# Patient Record
Sex: Male | Born: 1990 | Race: White | Hispanic: No | Marital: Married | Smoking: Never smoker
Health system: Southern US, Community
[De-identification: ages and names within clinical notes are randomized; demographics above are authoritative.]

## PROBLEM LIST (undated history)

## (undated) DIAGNOSIS — F419 Anxiety disorder, unspecified: Secondary | ICD-10-CM

## (undated) DIAGNOSIS — G43909 Migraine, unspecified, not intractable, without status migrainosus: Secondary | ICD-10-CM

## (undated) DIAGNOSIS — R0989 Other specified symptoms and signs involving the circulatory and respiratory systems: Secondary | ICD-10-CM

## (undated) DIAGNOSIS — J342 Deviated nasal septum: Principal | ICD-10-CM

## (undated) HISTORY — DX: Anxiety disorder, unspecified: F41.9

---
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
## 2011-04-19 NOTE — H&P (Signed)
NAMECREEK, GAN NO.:  [id number]   MEDICAL RECORD NO.:  [id number]          PATIENT TYPE:  IPS   LOCATION:  0501                          FACILITY:  [facility]   PHYSICIAN:  Geoffery Lyons, M.D.      DATE OF BIRTH:  [birth year]   DATE OF ADMISSION:  02/14/2008  DATE OF DISCHARGE:                       PSYCHIATRIC ADMISSION ASSESSMENT   IDENTIFYING INFORMATION:  An 20 year old white male who is single.  This  is a voluntary admission.   CHIEF COMPLAINT:  I tried to kill myself.   HISTORY OF PRESENT ILLNESS:  This 20 year old reports that he had taken  between 4 and 6 mg of Xanax yesterday and had been drinking some alcohol  at least every other day this week, and then he developed suicidal  thoughts, was working up the courage to cut himself.  Family found him  and stopped him, felt that he was upset about his girlfriend telling him  that she did not want him to call her for the next 30 days and wanted  some of her own space, time to think about their relationship.  Today,  he reports that he regrets that he had gotten that depressed, depressed  that he took Xanax, although says that he uses this regularly to stay  calmed down.  Says that he really does not want to kill himself, but  admits to using Xanax and alcohol.  Feels stressed by relationships at  home, a lot of pressure with his grandmother getting sick, and friction  arguing with his mother.  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  He denies  suicidal thoughts today, regrets his actions of yesterday, has voiced  his goal of wanting to be here to work on getting off substances.  The  patient reports that he uses anywhere from 1 to 3 mg of Xanax about  every other day, helps him stay calm time and has also been using his  girlfriend's Adderall approximately 60 mg, just on rare  occasion when  she will allow him to have some.   PAST PSYCHIATRIC HISTORY:  No prior psychiatric admissions.  He reports  that he had some difficulty with all learning, growing up with  restlessness, difficulty with concentration, began dropping out of  school in the ninth grade to smoke marijuana and drink alcohol.  He  started seeing a counselor around the age of 76, because he had reported  having dreams of wanting to kill a relative, although he never acted on  any of these dreams. He denies ever being prescribed psychotropic  medication but does take his girlfriend's Adderall.  He endorses a  history of difficulty with concentration, restlessness, and has never  had testing for ADD.   SOCIAL HISTORY:  The patient lives with his grandmother, mother and  sister, all in the home.  Mother with a history of alcohol and drug  problems, who is now achieved abstinence.  His father is not in his  life.  He has had three jobs in the past year and has a history of  misdemeanor charges for underage drinking.  No history of DWI.  Dropped  out of school in the ninth grade as noted.  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   MEDICAL HISTORY:  No regular primary care Kersti Scavone.  Medical problems  are none.   MEDICATIONS:  None.   DRUG ALLERGIES:  NO KNOWN DRUG ALLERGIES.   PHYSICAL EXAM:  GENERAL:  Was done in the emergency room at [facility],  as noted in the record.  This is a healthy 20 year old who appears to be  his stated age, pleasant cooperative.  He is in no acute distress, no  overt signs of any drug withdrawal, 5 feet 11 inches tall, 145 pounds.  VITAL SIGNS:  Temperature 97.7, pulse 95, respirations 18, blood  pressure 112/65.   DIAGNOSTIC STUDIES:  Were done in the emergency room.  Chemistry:  Sodium 141, potassium 3.9, chloride 108, carbon dioxide is 25, BUN 6,  creatinine is  1.1 and random glucose 96.  Alcohol level was 172, urine  drug screen positive for benzodiazepines and marijuana.  Acetaminophen  level was less 10.   MENTAL STATUS EXAM:  Fully alert gentleman, cooperative, in full contact  with reality, engaged in conversation, eye contact is good.  He is  polite.  Speech normal in pace, tone and production, gives a coherent  history.  Mood is depressed.  He regrets a suicide attempt, feels he has  been kind of weighed down by the conflict at home and gets distracted  and frustrated.  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  Thought process logical and  coherent.  No active suicidal thoughts today.  Has voiced his regret  over his actions yesterday.  Voices intent to get off and stay off Xanax  and alcohol.  No homicidal thoughts.  Cognition is fully preserved.  Insight is adequate.  Thinking is goal directed.  No evidence of  psychosis or confusion.   Axis I:  Depressive disorder NOS.  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  Axis II:  Deferred.  Axis III:  No diagnosis.  Axis IV:  Severe stress with adjustment to stage of life.  Stress from  relationship breakup.  Axis V:  Current, 45, past year 110.   PLAN:  To voluntarily admit the patient with every  15-minute checks in  place, with a goal of alleviating a suicidal thought.  We are going to  ask one of our counselors to begin an evaluation for attention deficit  disorder.  We are going to check his hepatic function and an RPR.  Meanwhile, we are going to start him on Librium 25 mg q.6 h. p.r.n., and  we will hear his mother's concerns.      Margaret A. Scott, N.P.      Geoffery Lyons, M.D.  Electronically Signed    MAS/MEDQ  D:  02/15/2008  T:  02/16/2008  Job:   [id number]

## 2011-04-22 NOTE — Discharge Summary (Signed)
NAMECAYDEN, GRANHOLM NO.:  [id number]   MEDICAL RECORD NO.:  [id number]          PATIENT TYPE:  IPS   LOCATION:  0501                          FACILITY:  [facility]   PHYSICIAN:  Geoffery Lyons, M.D.      DATE OF BIRTH:  01/19/1991   DATE OF ADMISSION:  02/14/2008  DATE OF DISCHARGE:  02/26/2008                               DISCHARGE SUMMARY   CHIEF COMPLAINT:  This was the first admission to [facility] for this 20 year old white male, single, voluntarily admitted.  He endorsed that he tried to kill himself.  He had taken between 4 and 6  mg of Xanax and had been drinking some alcohol at least every other day  and then he developed suicidal thoughts. He was working up the courage  to cut himself. Family found him and stopped him. Felt that he was upset  about his girlfriend telling him that she did not want him to call her  for the next 30 days. That she wanted some of her own space and time to  think about the relationship.  He did admit to using alcohol and Xanax.  Felt stressed by relationship.  At home of pressure with his grandmother  getting sick, friction and arguing with his mother where he really  wanted to leave the home and go and get into the [employer] in the next 4  months.  Had a hard time handling some of what he perceived as family  stress. Denied suicidal thoughts.  Regrets his actions. Voices call of  wanting to be in the unit to work on getting off the substances.   PAST PSYCHIATRIC HISTORY:  No prior psychiatric treatment. Long history  of difficulty with restlessness, with concentration, dropped out of  school in ninth grade. Had never been tested for ADHD but has used  girlfriends Adderall he claims successfully.   Also endorsed using marijuana as well as Xanax and alcohol.   MEDICAL HISTORY:  Noncontributory.   MEDICATIONS:  None prescribed.   PHYSICAL EXAM:  Failed to show any acute findings.   LABORATORY WORK:  Sodium 141,  potassium 3.9, BUN 6, creatinine 1.1,  glucose 96.  Alcohol level was 172.  UDS positive for benzodiazepines  and marijuana.   MENTAL STATUS EXAM:  Reveals a fully alert cooperative male in full  contact with reality. Speech was normal in pace, tone and production.  Mood was depressed.  Affect was depressed.  Endorsed being frustrated  with all the stress going on at home as well as due to the relationship  with the girlfriend who wanted some time off the relationship. Endorsed  no active suicidal ideations.  There are no evidence of delusions.  No  hallucinations.  Cognition well-preserved.   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  Benzodiazepine, alcohol abuse, marijuana abuse.  AXIS II: No diagnosis.  AXIS III:  No diagnosis.  AXIS IV: Moderate.  AXIS V:  On admission 45, GAF in the last year 70.   COURSE IN THE HOSPITAL:  He was admitted, started individual and  group  psychotherapy. He was placed on some Librium 25 on as needed basis.  He  was also given some Seroquel.  As already stated, he endorsed he was  depressed.  He took the Xanax, drank alcohol. tried to kill himself.  List of stressors- girlfriend, situation at home, planning to go into  the [employer]. Occasional using some Oxy 80s.  Thinking he was ADHD and  unable to focus, unable to write and remember what he wrote or what he  read.  Constantly changing the topic of conversation, left school at  ninth grade due to drugs and what he claims being distracted. He was  focused on trying to get the girlfriend to come to see him while he was  in the hospital. Having a hard time accepting that she might not want to  get together as he was not getting the response that he expected from  the girlfriend.  He then claimed that he was feeling again suicidal. He  had taken Paxil apparently in the past successfully and he was wanting  to try the Paxil again. He was started on 20 mg. The  next few days were  mostly of him complaining of anxiety, wanting something to help his  nerves.  He was given some Seroquel.  He wanted more than what  initially was made available to him. We tried to arrange a family  session with the girlfriend, but she endorsed that she was not going to  be able to come and this created more stress for him. Continued to  endorse that if he was to hear the girlfriend did not want to get back  with him that he would not know why he was going to with work on coping  skills, stress management.  Continued to endorse anxiety.  Sleep was an  issue.  By March 17,  he had not heard from the girlfriend. In his every  day interactions there was increasing evidence that indeed he might have  some difficulty with attention span, distractibility. He met with the  with the psychology intern. She did ADHD RS on a semi structure  background interview to rule out ADHD. There were significant range in  both the patient attention and had a hyperactivity impulsive sub scales  consistent with a diagnosis of ADHD combined type. Symptoms beginning  around age 63. These were validated by information given by the mother  and  we basically decided to postpone any treatment with stimulants  until he was out of the hospital and this was deferred to the outpatient  Ami Thornsberry.  He continued to endorse the longer history of anxiety,  frustration. Felt that the Seroquel was helping. Was concerned about  going back to the home as he claimed that there was another person stay  in the house that was abusing marijuana and drinking. Social worker was  trying to find some where else tentative to where he where could go.  Some alternate placement options were found.  On March 23, he was in  full contact reality.  In many ways he was better. Did not want to take  as much of the Seroquel.  He was able to deal with the anxiety in some  other ways other than depending completely on a medication.   He had  accepted the fact that the girlfriend was not going to want to be back  with him. While in the unit he met Marine that came from [place] and this  gentleman  did some mentoring and supported his plan of joining the  [employer]. He was very excited about this plan.  Wanted to keep himself  substance free. Was wanting to go back [facility] get his GED and work towards  joining [employer] in the next 4 months.   DISCHARGE DIAGNOSES:  AXIS I: Depressive disorder not otherwise  specified, alcohol, benzodiazepine, marijuana and opiate abuse. ADHD.  AXIS II: No diagnosis.  AXIS III:  No diagnosis.  AXIS IV: Moderate.  AXIS V:  On discharge 55-60.   Discharged on Paxil 20 mg per day, Seroquel 50 one to two at night for  sleep.  Followup at the [facility].      Geoffery Lyons, M.D.  Electronically Signed     IL/MEDQ  D:  03/24/2008  T:  03/24/2008  Job:  [id number]

## 2011-08-29 LAB — RAPID URINE DRUG SCREEN, HOSP PERFORMED
Amphetamines: NOT DETECTED
Barbiturates: NOT DETECTED
Cocaine: NOT DETECTED
Tetrahydrocannabinol: POSITIVE — AB

## 2011-08-29 LAB — ACETAMINOPHEN LEVEL: Acetaminophen (Tylenol), Serum: <10 — ABNORMAL LOW

## 2011-08-29 LAB — I-STAT 8, (EC8 V) (CONVERTED LAB)
Acid-base deficit: 3 — ABNORMAL HIGH
BUN: 6
Glucose, Bld: 96
HCT: 47
TCO2: 25
pH, Ven: 7.327 — ABNORMAL HIGH

## 2011-08-29 LAB — ETHANOL: Alcohol, Ethyl (B): 172 — ABNORMAL HIGH

## 2011-08-29 LAB — POCT I-STAT CREATININE: Operator id: 270651

## 2013-11-04 DIAGNOSIS — J342 Deviated nasal septum: Secondary | ICD-10-CM

## 2013-11-04 HISTORY — DX: Deviated nasal septum: J34.2

## 2013-12-02 ENCOUNTER — Encounter (HOSPITAL_BASED_OUTPATIENT_CLINIC_OR_DEPARTMENT_OTHER): Payer: Self-pay | Admitting: *Deleted

## 2013-12-02 DIAGNOSIS — R0989 Other specified symptoms and signs involving the circulatory and respiratory systems: Secondary | ICD-10-CM

## 2013-12-02 HISTORY — DX: Other specified symptoms and signs involving the circulatory and respiratory systems: R09.89

## 2013-12-09 ENCOUNTER — Encounter (HOSPITAL_COMMUNITY): Payer: Self-pay | Admitting: Emergency Medicine

## 2013-12-09 ENCOUNTER — Encounter (HOSPITAL_BASED_OUTPATIENT_CLINIC_OR_DEPARTMENT_OTHER): Payer: Self-pay

## 2013-12-09 ENCOUNTER — Ambulatory Visit (HOSPITAL_BASED_OUTPATIENT_CLINIC_OR_DEPARTMENT_OTHER)
Admission: RE | Admit: 2013-12-09 | Discharge: 2013-12-09 | Disposition: A | Discharge status: Home / Self Care | Payer: BC Managed Care – PPO | Source: Ambulatory Visit | Attending: Otolaryngology | Admitting: Otolaryngology

## 2013-12-09 ENCOUNTER — Encounter (HOSPITAL_BASED_OUTPATIENT_CLINIC_OR_DEPARTMENT_OTHER)
Admission: RE | Disposition: A | Discharge status: Home / Self Care | Payer: Self-pay | Source: Ambulatory Visit | Attending: Otolaryngology

## 2013-12-09 ENCOUNTER — Emergency Department (HOSPITAL_COMMUNITY)
Admission: EM | Admit: 2013-12-09 | Discharge: 2013-12-09 | Disposition: A | Discharge status: Home / Self Care | Payer: BC Managed Care – PPO | Attending: Emergency Medicine | Admitting: Emergency Medicine

## 2013-12-09 ENCOUNTER — Encounter (HOSPITAL_BASED_OUTPATIENT_CLINIC_OR_DEPARTMENT_OTHER): Payer: BC Managed Care – PPO | Admitting: Anesthesiology

## 2013-12-09 ENCOUNTER — Ambulatory Visit (HOSPITAL_BASED_OUTPATIENT_CLINIC_OR_DEPARTMENT_OTHER): Payer: BC Managed Care – PPO | Admitting: Anesthesiology

## 2013-12-09 DIAGNOSIS — R040 Epistaxis: Secondary | ICD-10-CM

## 2013-12-09 DIAGNOSIS — J3489 Other specified disorders of nose and nasal sinuses: Secondary | ICD-10-CM | POA: Insufficient documentation

## 2013-12-09 DIAGNOSIS — Z9889 Other specified postprocedural states: Secondary | ICD-10-CM

## 2013-12-09 DIAGNOSIS — J343 Hypertrophy of nasal turbinates: Secondary | ICD-10-CM | POA: Insufficient documentation

## 2013-12-09 DIAGNOSIS — Y838 Other surgical procedures as the cause of abnormal reaction of the patient, or of later complication, without mention of misadventure at the time of the procedure: Secondary | ICD-10-CM | POA: Insufficient documentation

## 2013-12-09 DIAGNOSIS — Z8669 Personal history of other diseases of the nervous system and sense organs: Secondary | ICD-10-CM | POA: Insufficient documentation

## 2013-12-09 DIAGNOSIS — IMO0002 Reserved for concepts with insufficient information to code with codable children: Secondary | ICD-10-CM | POA: Insufficient documentation

## 2013-12-09 DIAGNOSIS — J342 Deviated nasal septum: Secondary | ICD-10-CM | POA: Insufficient documentation

## 2013-12-09 HISTORY — DX: Other specified symptoms and signs involving the circulatory and respiratory systems: R09.89

## 2013-12-09 HISTORY — DX: Migraine, unspecified, not intractable, without status migrainosus: G43.909

## 2013-12-09 HISTORY — PX: NASAL SEPTOPLASTY W/ TURBINOPLASTY: SHX2070

## 2013-12-09 HISTORY — DX: Deviated nasal septum: J34.2

## 2013-12-09 LAB — POCT HEMOGLOBIN-HEMACUE: HEMOGLOBIN: 14.1 g/dL (ref 13.0–17.0)

## 2013-12-09 SURGERY — SEPTOPLASTY, NOSE, WITH NASAL TURBINATE REDUCTION
Anesthesia: General | Laterality: Bilateral

## 2013-12-09 MED ORDER — MUPIROCIN 2 % EX OINT
TOPICAL_OINTMENT | CUTANEOUS | Status: AC
  Filled 2013-12-09: qty 22

## 2013-12-09 MED ORDER — LIDOCAINE-EPINEPHRINE 1 %-1:100000 IJ SOLN
INTRAMUSCULAR | Status: AC
  Filled 2013-12-09: qty 1

## 2013-12-09 MED ORDER — MIDAZOLAM HCL 2 MG/2ML IJ SOLN
INTRAMUSCULAR | Status: AC
  Filled 2013-12-09: qty 2

## 2013-12-09 MED ORDER — HYDROMORPHONE HCL PF 1 MG/ML IJ SOLN
0.2500 mg | INTRAMUSCULAR | Status: DC | PRN
  Administered 2013-12-09 (×4): 0.5 mg via INTRAVENOUS

## 2013-12-09 MED ORDER — OXYCODONE HCL 5 MG PO TABS: 5.0000 mg | ORAL_TABLET | Freq: Once | ORAL | Status: DC | PRN

## 2013-12-09 MED ORDER — OXYMETAZOLINE HCL 0.05 % NA SOLN
NASAL | Status: AC
  Filled 2013-12-09: qty 15

## 2013-12-09 MED ORDER — LIDOCAINE HCL (CARDIAC) 20 MG/ML IV SOLN
INTRAVENOUS | Status: DC | PRN
  Administered 2013-12-09: 100 mg via INTRAVENOUS

## 2013-12-09 MED ORDER — LACTATED RINGERS IV SOLN
INTRAVENOUS | Status: DC
  Administered 2013-12-09 (×2): via INTRAVENOUS

## 2013-12-09 MED ORDER — MORPHINE SULFATE 2 MG/ML IJ SOLN
2.0000 mg | Freq: Once | INTRAMUSCULAR | Status: AC
Start: 2013-12-09 — End: 2013-12-09
  Administered 2013-12-09: 2 mg via INTRAVENOUS
  Filled 2013-12-09: qty 1

## 2013-12-09 MED ORDER — OXYCODONE HCL 5 MG/5ML PO SOLN: 5.0000 mg | Freq: Once | ORAL | Status: DC | PRN

## 2013-12-09 MED ORDER — PROPOFOL 10 MG/ML IV BOLUS
INTRAVENOUS | Status: DC | PRN
  Administered 2013-12-09: 200 mg via INTRAVENOUS

## 2013-12-09 MED ORDER — HYDROMORPHONE HCL PF 1 MG/ML IJ SOLN
INTRAMUSCULAR | Status: AC
  Filled 2013-12-09: qty 1

## 2013-12-09 MED ORDER — CEFAZOLIN SODIUM 1-5 GM-% IV SOLN
INTRAVENOUS | Status: AC
  Filled 2013-12-09: qty 100

## 2013-12-09 MED ORDER — OXYMETAZOLINE HCL 0.05 % NA SOLN
NASAL | Status: DC | PRN
  Administered 2013-12-09: 1 via NASAL

## 2013-12-09 MED ORDER — SODIUM CHLORIDE 0.9 % IV SOLN
Freq: Once | INTRAVENOUS | Status: AC
  Administered 2013-12-09: 1000 mL via INTRAVENOUS

## 2013-12-09 MED ORDER — MIDAZOLAM HCL 2 MG/2ML IJ SOLN: 1.0000 mg | INTRAMUSCULAR | Status: DC | PRN

## 2013-12-09 MED ORDER — FENTANYL CITRATE 0.05 MG/ML IJ SOLN
INTRAMUSCULAR | Status: AC
  Filled 2013-12-09: qty 6

## 2013-12-09 MED ORDER — DEXAMETHASONE SODIUM PHOSPHATE 4 MG/ML IJ SOLN
INTRAMUSCULAR | Status: DC | PRN
  Administered 2013-12-09: 10 mg via INTRAVENOUS

## 2013-12-09 MED ORDER — HYDROCODONE-ACETAMINOPHEN 5-325 MG PO TABS: 1.0000 | ORAL_TABLET | ORAL | Status: DC | PRN

## 2013-12-09 MED ORDER — CEFAZOLIN SODIUM-DEXTROSE 2-3 GM-% IV SOLR
INTRAVENOUS | Status: DC | PRN
  Administered 2013-12-09: 2 g via INTRAVENOUS

## 2013-12-09 MED ORDER — FENTANYL CITRATE 0.05 MG/ML IJ SOLN: 50.0000 ug | INTRAMUSCULAR | Status: DC | PRN

## 2013-12-09 MED ORDER — OXYCODONE-ACETAMINOPHEN 5-325 MG PO TABS
2.0000 | ORAL_TABLET | Freq: Once | ORAL | Status: AC
  Administered 2013-12-09: 2 via ORAL
  Filled 2013-12-09: qty 2

## 2013-12-09 MED ORDER — MIDAZOLAM HCL 2 MG/ML PO SYRP: 12.0000 mg | ORAL_SOLUTION | Freq: Once | ORAL | Status: DC | PRN

## 2013-12-09 MED ORDER — SUCCINYLCHOLINE CHLORIDE 20 MG/ML IJ SOLN
INTRAMUSCULAR | Status: DC | PRN
  Administered 2013-12-09: 100 mg via INTRAVENOUS

## 2013-12-09 MED ORDER — ONDANSETRON HCL 4 MG/2ML IJ SOLN
INTRAMUSCULAR | Status: DC | PRN
  Administered 2013-12-09: 4 mg via INTRAVENOUS

## 2013-12-09 MED ORDER — OXYMETAZOLINE HCL 0.05 % NA SOLN
1.0000 | Freq: Two times a day (BID) | NASAL | Status: DC
  Administered 2013-12-09: 1 via NASAL

## 2013-12-09 MED ORDER — FENTANYL CITRATE 0.05 MG/ML IJ SOLN
INTRAMUSCULAR | Status: DC | PRN
  Administered 2013-12-09: 100 ug via INTRAVENOUS
  Administered 2013-12-09: 50 ug via INTRAVENOUS

## 2013-12-09 MED ORDER — PROMETHAZINE HCL 25 MG/ML IJ SOLN
25.0000 mg | Freq: Once | INTRAMUSCULAR | Status: AC
  Administered 2013-12-09: 25 mg via INTRAVENOUS
  Filled 2013-12-09: qty 1

## 2013-12-09 MED ORDER — MIDAZOLAM HCL 5 MG/5ML IJ SOLN
INTRAMUSCULAR | Status: DC | PRN
  Administered 2013-12-09: 2 mg via INTRAVENOUS

## 2013-12-09 MED ORDER — LIDOCAINE-EPINEPHRINE 1 %-1:100000 IJ SOLN
INTRAMUSCULAR | Status: DC | PRN
  Administered 2013-12-09: 3.5 mL

## 2013-12-09 MED ORDER — MUPIROCIN 2 % EX OINT
TOPICAL_OINTMENT | CUTANEOUS | Status: DC | PRN
  Administered 2013-12-09: 1 via NASAL

## 2013-12-09 MED ORDER — AMOXICILLIN 875 MG PO TABS: 875.0000 mg | ORAL_TABLET | Freq: Two times a day (BID) | ORAL | Status: DC

## 2013-12-09 SURGICAL SUPPLY — 34 items
ATTRACTOMAT 16X20 MAGNETIC DRP (DRAPES) ×3 IMPLANT
CANISTER SUCT 1200ML W/VALVE (MISCELLANEOUS) ×3 IMPLANT
COAGULATOR SUCT 8FR VV (MISCELLANEOUS) ×3 IMPLANT
DECANTER SPIKE VIAL GLASS SM (MISCELLANEOUS) ×3 IMPLANT
DRSG NASOPORE 8CM (GAUZE/BANDAGES/DRESSINGS) IMPLANT
DRSG TELFA 3X8 NADH (GAUZE/BANDAGES/DRESSINGS) IMPLANT
ELECT REM PT RETURN 9FT ADLT (ELECTROSURGICAL) ×3
ELECTRODE REM PT RTRN 9FT ADLT (ELECTROSURGICAL) ×1 IMPLANT
GLOVE BIO SURGEON STRL SZ7.5 (GLOVE) ×3 IMPLANT
GLOVE BIOGEL M 7.0 STRL (GLOVE) ×3 IMPLANT
GLOVE BIOGEL PI IND STRL 7.5 (GLOVE) ×1 IMPLANT
GLOVE BIOGEL PI INDICATOR 7.5 (GLOVE) ×2
GOWN PREVENTION PLUS XLARGE (GOWN DISPOSABLE) ×6 IMPLANT
GOWN STRL REUS W/ TWL LRG LVL3 (GOWN DISPOSABLE) IMPLANT
GOWN STRL REUS W/TWL LRG LVL3 (GOWN DISPOSABLE)
NEEDLE HYPO 25X1 1.5 SAFETY (NEEDLE) ×3 IMPLANT
NS IRRIG 1000ML POUR BTL (IV SOLUTION) ×3 IMPLANT
PACK BASIN DAY SURGERY FS (CUSTOM PROCEDURE TRAY) ×3 IMPLANT
PACK ENT DAY SURGERY (CUSTOM PROCEDURE TRAY) ×3 IMPLANT
SLEEVE SCD COMPRESS KNEE MED (MISCELLANEOUS) ×3 IMPLANT
SOLUTION BUTLER CLEAR DIP (MISCELLANEOUS) ×3 IMPLANT
SPLINT NASAL DOYLE BI-VL (GAUZE/BANDAGES/DRESSINGS) ×3 IMPLANT
SPONGE GAUZE 2X2 8PLY STER LF (GAUZE/BANDAGES/DRESSINGS) ×1
SPONGE GAUZE 2X2 8PLY STRL LF (GAUZE/BANDAGES/DRESSINGS) ×2 IMPLANT
SPONGE NEURO XRAY DETECT 1X3 (DISPOSABLE) ×3 IMPLANT
SUT CHROMIC 4 0 P 3 18 (SUTURE) ×3 IMPLANT
SUT PLAIN 4 0 ~~LOC~~ 1 (SUTURE) ×3 IMPLANT
SUT PROLENE 3 0 PS 2 (SUTURE) ×3 IMPLANT
SUT VIC AB 4-0 P-3 18XBRD (SUTURE) IMPLANT
SUT VIC AB 4-0 P3 18 (SUTURE)
TOWEL OR 17X24 6PK STRL BLUE (TOWEL DISPOSABLE) ×3 IMPLANT
TUBE SALEM SUMP 12R W/ARV (TUBING) IMPLANT
TUBE SALEM SUMP 16 FR W/ARV (TUBING) ×3 IMPLANT
YANKAUER SUCT BULB TIP NO VENT (SUCTIONS) ×3 IMPLANT

## 2013-12-09 NOTE — Transfer of Care (Signed)
Immediate Anesthesia Transfer of Care Note  Patient: Douglas Fitzgerald  Procedure(s) Performed: Procedure(s): NASAL SEPTOPLASTY WITH BILATERAL TURBINATE REDUCTION (Bilateral)  Patient Location: PACU  Anesthesia Type:General  Level of Consciousness: awake and alert   Airway & Oxygen Therapy: Patient Spontanous Breathing and Patient connected to face mask oxygen  Post-op Assessment: Report given to PACU RN and Post -op Vital signs reviewed and stable  Post vital signs: Reviewed and stable  Complications: No apparent anesthesia complications

## 2013-12-09 NOTE — Anesthesia Procedure Notes (Signed)
Procedure Name: Intubation Date/Time: 12/09/2013 8:33 AM Performed by: Caren MacadamARTER, Mykal Kirchman W Pre-anesthesia Checklist: Patient identified, Emergency Drugs available, Suction available and Patient being monitored Patient Re-evaluated:Patient Re-evaluated prior to inductionOxygen Delivery Method: Circle System Utilized Preoxygenation: Pre-oxygenation with 100% oxygen Intubation Type: IV induction Ventilation: Mask ventilation without difficulty Laryngoscope Size: Miller and 2 Tube type: Oral Tube size: 7.0 mm Number of attempts: 1 Airway Equipment and Method: stylet and oral airway Placement Confirmation: ETT inserted through vocal cords under direct vision,  positive ETCO2 and breath sounds checked- equal and bilateral Secured at: 24 cm Tube secured with: Tape Dental Injury: Teeth and Oropharynx as per pre-operative assessment
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
## 2013-12-09 NOTE — ED Notes (Signed)
Talked with Dr. Corliss Marcuseal, states he is on his way to see the patient.

## 2013-12-09 NOTE — ED Notes (Addendum)
Per ems, pt had septoplasty and turbinate reduction surgery this morning, no complications. About 1745, pt nose started trickling, then gushing. Pt in ED, bleeding from both nostrils, AAOx4, Dr. Blinda LeatherwoodPollina at bedside.
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
## 2013-12-09 NOTE — Discharge Instructions (Addendum)
POSTOPERATIVE INSTRUCTIONS FOR PATIENTS HAVING NASAL OR SINUS OPERATIONS ACTIVITY: Restrict activity at home for the first two days, resting as much as possible. Light activity is best. You may usually return to work within a week. You should refrain from nose blowing, strenuous activity, or heavy lifting greater than 20lbs for a total of three weeks after your operation.  If sneezing cannot be avoided, sneeze with your mouth open. DISCOMFORT: You may experience a dull headache and pressure along with nasal congestion and discharge. These symptoms may be worse during the first week after the operation but may last as long as two to four weeks.  Please take Tylenol or the pain medication that has been prescribed for you. Do not take aspirin or aspirin containing medications since they may cause bleeding.  You may experience symptoms of post nasal drainage, nasal congestion, headaches and fatigue for two or three months after your operation.  BLEEDING: You may have some blood tinged nasal drainage for approximately two weeks after the operation.  The discharge will be worse for the first week.  Please call our office at [phone] or go to the nearest hospital emergency room if you experience any of the following: heavy, bright red blood from your nose or mouth that lasts longer than ten minutes or coughing up or vomiting bright red blood or blood clots. GENERAL CONSIDERATIONS: 1. A gauze dressing will be placed on your upper lip to absorb any drainage after the operation. You may need to change this several times a day.  If you do not have very much drainage, you may remove the dressing.  Remember that you may gently wipe your nose with a tissue and sniff in, but DO NOT blow your nose. 2. Please keep all of your postoperative appointments.  Your final results after the operation will depend on proper follow-up.  The initial visit is usually four to seven days after the operation.  During this visit, the  remaining nasal packing and internal septal splints will be removed.  Your nasal and sinus cavities will be cleaned.  During the second visit, your nasal and sinus cavities will be cleaned again. Have someone drive you to your first two postoperative appointments. We suggest that you take your prescribed pain medication about  hour prior to each of these two appointments.  3. How you care for your nose after the operation will influence the results that you obtain.  You should follow all directions, take your medication as prescribed, and call our office [phone] with any problems or questions. 4. You may be more comfortable sleeping with your head elevated on two pillows. 5. Do not take any medications that we have not prescribed or recommended. WARNING SIGNS: if any of the following should occur, please call our office: 1. Bright red bleeding which lasts more than 10 minutes. 2. Persistent fever greater than 102F. 3. Persistent vomiting. 4. Severe and constant pain that is not relieved by prescribed pain medication. 5. Trauma to the nose. 6. Rash or unusual side effects from any medicines.  Your return appointment is JAN.9, 2015 AT 1:40PM  Post Anesthesia Home Care Instructions  Activity: Get plenty of rest for the remainder of the day. A responsible adult should stay with you for 24 hours following the procedure.  For the next 24 hours, DO NOT: -Drive a car -Advertising copywriter -Drink alcoholic beverages -Take any medication unless instructed by your physician -Make any legal decisions or sign important papers.  Meals: Start with liquid  foods such as gelatin or soup. Progress to regular foods as tolerated. Avoid greasy, spicy, heavy foods. If nausea and/or vomiting occur, drink only clear liquids until the nausea and/or vomiting subsides. Call your physician if vomiting continues.  Special Instructions/Symptoms: Your throat may feel dry or sore from the anesthesia or the breathing  tube placed in your throat during surgery. If this causes discomfort, gargle with warm salt water. The discomfort should disappear within 24 hours.

## 2013-12-09 NOTE — Anesthesia Preprocedure Evaluation (Addendum)
Anesthesia Evaluation  Patient identified by MRN, date of birth, ID band Patient awake    Reviewed: Allergy & Precautions, H&P , NPO status , Patient's Chart, lab work & pertinent test results  Airway Mallampati: II TM Distance: >3 FB Neck ROM: Full    Dental no notable dental hx. (+) Teeth Intact and Dental Advisory Given   Pulmonary neg pulmonary ROS,  breath sounds clear to auscultation  Pulmonary exam normal       Cardiovascular negative cardio ROS  Rhythm:Regular Rate:Normal     Neuro/Psych  Headaches, negative neurological ROS  negative psych ROS   GI/Hepatic negative GI ROS, Neg liver ROS,   Endo/Other  negative endocrine ROS  Renal/GU negative Renal ROS  negative genitourinary   Musculoskeletal   Abdominal   Peds  Hematology negative hematology ROS (+)   Anesthesia Other Findings   Reproductive/Obstetrics negative OB ROS                          Anesthesia Physical Anesthesia Plan  ASA: I  Anesthesia Plan: General   Post-op Pain Management:    Induction: Intravenous  Airway Management Planned: Oral ETT  Additional Equipment:   Intra-op Plan:   Post-operative Plan: Extubation in OR  Informed Consent: I have reviewed the patients History and Physical, chart, labs and discussed the procedure including the risks, benefits and alternatives for the proposed anesthesia with the patient or authorized representative who has indicated his/her understanding and acceptance.   Dental advisory given  Plan Discussed with: CRNA  Anesthesia Plan Comments:         Anesthesia Quick Evaluation

## 2013-12-09 NOTE — Progress Notes (Signed)
Pt with postop bleeding from the left nostril and posterior pharynx.  A large amount of blood is suctioned from the left [place].  The Doyle splints are removed.  Bilateral merocel packings are placed.  Pt is given 1L of NS.  The Merocel packing will be removed in 4 days.

## 2013-12-09 NOTE — Op Note (Signed)
DATE OF PROCEDURE: 12/09/2013  OPERATIVE REPORT   SURGEON: Newman PiesSu Rozalyn Osland, MD   PREOPERATIVE DIAGNOSES:  1. Severe nasal septal deviation.  2. Bilateral inferior turbinate hypertrophy.  3. Chronic nasal obstruction.  POSTOPERATIVE DIAGNOSES:  1. Severe nasal septal deviation.  2. Bilateral inferior turbinate hypertrophy.  3. Chronic nasal obstruction.  PROCEDURE PERFORMED:  1. Septoplasty.  2. Bilateral partial inferior turbinate resection.   ANESTHESIA: General endotracheal tube anesthesia.   COMPLICATIONS: None.   ESTIMATED BLOOD LOSS: Less than xxx mL.   INDICATION FOR PROCEDURE: Douglas Fitzgerald is a 23 y.o. male with a history of chronic nasal obstruction. The patient was  treated with antihistamine, decongestant, steroid nasal spray, and systemic steroids. However, the patient continues to be symptomatic. On examination, the patient was noted to have bilateral severe inferior turbinate hypertrophy and significant nasal septal deviation, causing significant nasal obstruction. Based on the above findings, the decision was made for the patient to undergo the above-stated procedure. The risks, benefits, alternatives, and details of the procedure were discussed with the patient. Questions were invited and answered. Informed consent was obtained.   DESCRIPTION OF PROCEDURE: The patient was taken to the operating room and placed supine on the operating table. General endotracheal tube anesthesia was administered by the anesthesiologist. The patient was positioned, and prepped and draped in the standard fashion for nasal surgery. Pledgets soaked with Afrin were placed in both nasal cavities for decongestion. The pledgets were subsequently removed. The above mentioned severe septal deviation was again noted. 1% lidocaine with 1:100,000 epinephrine was injected onto the nasal septum bilaterally. A hemitransfixion incision was made on the left side. The mucosal flap was carefully elevated on the left  side. A cartilaginous incision was made 1 cm superior to the caudal margin of the nasal septum. Mucosal flap was also elevated on the right side in the similar fashion. It should be noted that due to the severe septal deviation, the deviated portion of the cartilaginous and bony septum had to be removed in piecemeal fashion. Once the deviated portions were removed, a straight midline septum was achieved. The septum was then quilted with 4-0 plain gut sutures. The hemitransfixion incision was closed with interrupted 4-0 chromic sutures. Doyle splints were applied.   [facility], the inferior one half of both hypertrophied inferior turbinate was crossclamped with a Kelly clamp. The inferior one half of each inferior turbinate was then resected with a pair of cross cutting scissors. Hemostasis was achieved with a suction cautery device. A small polypoid tissue was noted in the posterior left nasal cavity.  It was removed and sent to the pathology department.  The care of the patient was turned over to the anesthesiologist. The patient was awakened from anesthesia without difficulty. The patient was extubated and transferred to the recovery room in good condition.   OPERATIVE FINDINGS: Severe nasal septal deviation and bilateral inferior turbinate hypertrophy. A polypoid tissue was noted at the posterior left nasal cavity.  SPECIMEN: None.   FOLLOWUP CARE: The patient be discharged home once he is awake and alert. The patient will be placed on vicodin1-2 tablets p.o. q.6 hours p.r.n. pain, and amoxicillin 875 mg p.o. b.i.d. for 5 days. The patient will follow up in my office in approximately 1 week for splint removal.   Journei Thomassen Philomena DohenyWooi Shail Urbas, MD

## 2013-12-09 NOTE — ED Provider Notes (Signed)
CSN: [id number]     Arrival date & time 12/09/13  1843 History   First MD Initiated Contact with Patient 12/09/13 1855     Chief Complaint  Patient presents with  . Epistaxis  . Post-op Problem   (Consider location/radiation/quality/duration/timing/severity/associated sxs/prior Treatment) HPI Comments: Patient presents to the ER for evaluation of nosebleed. Patient underwent septoplasty earlier this morning by Doctor Suszanne Conners. He reports that he was, resting in bed when he started to notice some bleeding from the left side of his nose which then became quite brisk. The patient plating of continuous pain in the nasal area since the surgery without change since the bleeding started. The chest pain or shortness of breath. He denies any trauma to the nose.  Patient is a 23 y.o. male presenting with nosebleeds.  Epistaxis   Past Medical History  Diagnosis Date  . Migraines   . Deviated nasal septum 11/2013  . Runny nose 12/02/2013   Past Surgical History  Procedure Laterality Date  . No past surgeries     No family history on file. History  Substance Use Topics  . Smoking status: Never Smoker   . Smokeless tobacco: Never Used  . Alcohol Use: No    Review of Systems  HENT: Positive for nosebleeds.   All other systems reviewed and are negative.    Allergies  Review of patient's allergies indicates no known allergies.  Home Medications   Current Outpatient Rx  Name  Route  Sig  Dispense  Refill  . amoxicillin (AMOXIL) 875 MG tablet   Oral   Take 1 tablet (875 mg total) by mouth 2 (two) times daily.   10 tablet   0   . HYDROcodone-acetaminophen (NORCO) 5-325 MG per tablet   Oral   Take 1 tablet by mouth every 4 (four) hours as needed for moderate pain or severe pain.   20 tablet   0    BP 112/50  Pulse 102  Resp 18  SpO2 95% Physical Exam  Constitutional: He is oriented to person, place, and time. He appears well-developed and well-nourished. No distress.  HENT:   Head: Normocephalic and atraumatic.  Right Ear: Hearing normal.  Left Ear: Hearing normal.  Nose: Epistaxis (left side - large clot present, persistant bleeding after evacuation) is observed.  Mouth/Throat: Oropharynx is clear and moist and mucous membranes are normal.  Eyes: Conjunctivae and EOM are normal. Pupils are equal, round, and reactive to light.  Neck: Normal range of motion. Neck supple.  Cardiovascular: Regular rhythm, S1 normal and S2 normal.  Exam reveals no gallop and no friction rub.   No murmur heard. Pulmonary/Chest: Effort normal and breath sounds normal. No respiratory distress. He exhibits no tenderness.  Abdominal: Soft. Normal appearance and bowel sounds are normal. There is no hepatosplenomegaly. There is no tenderness. There is no rebound, no guarding, no tenderness at McBurney's point and negative Murphy's sign. No hernia.  Musculoskeletal: Normal range of motion.  Neurological: He is alert and oriented to person, place, and time. He has normal strength. No cranial nerve deficit or sensory deficit. Coordination normal. GCS eye subscore is 4. GCS verbal subscore is 5. GCS motor subscore is 6.  Skin: Skin is warm, dry and intact. No rash noted. No cyanosis.  Psychiatric: He has a normal mood and affect. His speech is normal and behavior is normal. Thought content normal.    ED Course  Procedures (including critical care time) Labs Review Labs Reviewed - No data to display  Imaging Review No results found.  EKG Interpretation   None       MDM  Diagnosis: Epistaxis, postoperative bleed  Patient presents to the ER for evaluation of bleeding from the left side of his nose. Patient underwent septoplasty earlier this morning. Examination reveals large amount of clot in the nose and once evacuated, there is brisk bleeding from the area. A piece of Surgicel was placed in the nose with reduction in the amount of bleeding. Doctor Suszanne Connerseoh will see in ER.    Gilda Creasehristopher  J. Pollina, MD 12/09/13 [id number]

## 2013-12-09 NOTE — Anesthesia Postprocedure Evaluation (Signed)
  Anesthesia Post-op Note  Patient: Douglas Fitzgerald  Procedure(s) Performed: Procedure(s): NASAL SEPTOPLASTY WITH BILATERAL TURBINATE REDUCTION (Bilateral)  Patient Location: PACU  Anesthesia Type:General  Level of Consciousness: awake and alert   Airway and Oxygen Therapy: Patient Spontanous Breathing  Post-op Pain: mild  Post-op Assessment: Post-op Vital signs reviewed, Patient's Cardiovascular Status Stable and Respiratory Function Stable  Post-op Vital Signs: Reviewed  Filed Vitals:   12/09/13 1212  BP: 146/73  Pulse: 94  Temp: 36.8 C  Resp: 16    Complications: No apparent anesthesia complications

## 2013-12-09 NOTE — Discharge Instructions (Signed)

## 2013-12-10 ENCOUNTER — Encounter (HOSPITAL_BASED_OUTPATIENT_CLINIC_OR_DEPARTMENT_OTHER): Payer: Self-pay | Admitting: Otolaryngology

## 2018-06-11 ENCOUNTER — Encounter: Payer: Self-pay | Admitting: Family Medicine

## 2018-06-11 ENCOUNTER — Ambulatory Visit (INDEPENDENT_AMBULATORY_CARE_PROVIDER_SITE_OTHER): Payer: Self-pay | Admitting: Family Medicine

## 2018-06-11 DIAGNOSIS — F419 Anxiety disorder, unspecified: Secondary | ICD-10-CM | POA: Insufficient documentation

## 2018-06-11 DIAGNOSIS — F1911 Other psychoactive substance abuse, in remission: Secondary | ICD-10-CM | POA: Insufficient documentation

## 2018-06-11 DIAGNOSIS — F321 Major depressive disorder, single episode, moderate: Secondary | ICD-10-CM

## 2018-06-11 MED ORDER — CITALOPRAM HYDROBROMIDE 20 MG PO TABS: 40.0000 mg | ORAL_TABLET | Freq: Every day | ORAL | 3 refills | Status: DC

## 2018-06-11 NOTE — Assessment & Plan Note (Signed)
GAD significantly elevated to 21 today.  Discussed treatment options with patient.  He would like to start daily medication.  Will start Celexa 20 mg daily and increase to 40 mg daily if he tolerates well within 1 to 2 weeks.  Discussed potential side effects of this medication.  Also discussed psychotherapy, however patient declined at this point as he would be unable to afford it.  He will follow-up with me in 4 to 6 weeks.

## 2018-06-11 NOTE — Assessment & Plan Note (Signed)
See anxiety A/P.  PHQ 9 elevated 18 today.  Start Celexa and increase to 40 mg daily as tolerated.  Follow-up in 4 to 6 weeks.

## 2018-06-11 NOTE — Patient Instructions (Addendum)
It was very nice to see you today!  We will start celexa today.  Please take 1 pill a day for the next 1-2 weeks. If you do well without side effects, you can increase to 1 pills daily  It will take about 6 weeks for these meds to get to their maximal effectiveness.   Please let me know if you have any severe side effects.  I would like to see you back in 4-6 weeks to check in, or sooner as needed.  Take care, Dr Jimmey RalphParker

## 2018-06-11 NOTE — Progress Notes (Signed)
Subjective:  Douglas Fitzgerald is a 27 y.o. male who presents today with a chief complaint of anxiety and to establish care.   HPI:  Depression/Anxiety, New problems Symptoms started about 10 years ago. Symptoms have worsened over the past few weeks. He recently found out his wife is pregnant which has worsened his symptoms.  Symptoms seem to be worse in high stress environments at work.  Frequently will feel very tensed up and start sweating.  Symptoms sometimes talk panic attack.  He is also had itching all over his skin.  Appetite is been decreased as well.  Sleeping normally.  No obvious alleviating or aggravating factors.    Depression screen PHQ 2/9 06/11/2018  Decreased Interest 3  Down, Depressed, Hopeless 3  PHQ - 2 Score 6  Altered sleeping 2  Tired, decreased energy 0  Change in appetite 1  Feeling bad or failure about yourself  3  Trouble concentrating 3  Moving slowly or fidgety/restless 3  Suicidal thoughts 0  PHQ-9 Score 18  Difficult doing work/chores Very difficult    GAD 7 : Generalized Anxiety Score 06/11/2018  Nervous, Anxious, on Edge 3  Control/stop worrying 3  Worry too much - different things 3  Trouble relaxing 3  Restless 3  Easily annoyed or irritable 3  Afraid - awful might happen 3  Total GAD 7 Score 21  Anxiety Difficulty Somewhat difficult    ROS: No SI or HI.  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  ROS: Per HPI, otherwise a complete review of systems was negative.   PMH:  The following were reviewed and entered/updated in epic: Past Medical History:  Diagnosis Date  . Anxiety   . Deviated nasal septum 11/2013  . Migraines   . Runny nose 12/02/2013   Patient Active Problem List   Diagnosis Date Noted  . Anxiety 06/11/2018  . Depression, major, single episode, moderate (HCC) 06/11/2018  . 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 NASAL SEPTOPLASTY W/ TURBINOPLASTY Bilateral 12/09/2013   Procedure: NASAL SEPTOPLASTY WITH BILATERAL TURBINATE REDUCTION;  Surgeon: Darletta Moll, MD;  Location: [facility];  Service: ENT;  Laterality: Bilateral;    Family History  Problem Relation Age of Onset  . Depression Mother        Died due to MVA  . Cancer Father     Medications- reviewed and updated Current Outpatient Medications  Medication Sig Dispense Refill  . citalopram (CELEXA) 20 MG tablet Take 2 tablets (40 mg total) by mouth daily. 60 tablet 3   No current facility-administered medications for this visit.     Allergies-reviewed and updated No Known Allergies  Social History   Socioeconomic History  . Marital status: Married    Spouse name: Not on file  . Number of children: 1  . Years of education: Not on file  . Highest education level: Not on file  Occupational History  . Not on file  Social Needs  . Financial resource strain: Not on file  . Food insecurity:    Worry: Not on file    Inability: Not on file  . Transportation needs:    Medical: Not on file    Non-medical: Not on file  Tobacco Use  . Smoking status: Never Smoker  . Smokeless tobacco: Never Used  Substance and Sexual Activity  .  Alcohol use: No  . Drug use: No  . Sexual activity: Yes  Lifestyle  . Physical activity:    Days per week: Not on file    Minutes per session: Not on file  . Stress: Not on file  Relationships  . Social connections:    Talks on phone: Not on file    Gets together: Not on file    Attends religious service: Not on file    Active member of club or organization: Not on file    Attends meetings of clubs or organizations: Not on file    Relationship status: Not on file  Other Topics Concern  . Not on file  Social History Narrative  . Not on file    Objective:  Physical Exam: BP 134/78 (BP Location: Left Arm, Patient Position: Sitting, Cuff  Size: Normal)   Pulse 80   Temp 98.4 F (36.9 C) (Oral)   Ht 6\' 2"  (1.88 m)   Wt 199 lb 3.2 oz (90.4 kg)   SpO2 98%   BMI 25.58 kg/m   Gen: NAD, resting comfortably CV: RRR with no murmurs appreciated Pulm: NWOB, CTAB with no crackles, wheezes, or rhonchi GI: Normal bowel sounds present. Soft, Nontender, Nondistended. MSK: No edema, cyanosis, or clubbing noted Skin: Warm, dry Neuro: Grossly normal, moves all extremities Psych: Normal affect and thought content  Assessment/Plan:  Anxiety GAD significantly elevated to 21 today.  Discussed treatment options with patient.  He would like to start daily medication.  Will start Celexa 20 mg daily and increase to 40 mg daily if he tolerates well within 1 to 2 weeks.  Discussed potential side effects of this medication.  Also discussed psychotherapy, however patient declined at this point as he would be unable to afford it.  He will follow-up with me in 4 to 6 weeks.  Depression, major, single episode, moderate (HCC) See anxiety A/P.  PHQ 9 elevated 18 today.  Start Celexa and increase to 40 mg daily as tolerated.  Follow-up in 4 to 6 weeks.  Substance abuse in remission [facility](HCC) Patient has been clean for 10 years.  Congratulated him on his abstinence.  Katina Degreealeb M. Jimmey RalphParker, MD 06/11/2018 4:06 PM

## 2018-06-11 NOTE — Assessment & Plan Note (Signed)
Patient has been clean for 10 years.  Congratulated him on his abstinence.

## 2018-07-09 ENCOUNTER — Ambulatory Visit: Payer: Self-pay | Admitting: Family Medicine

## 2018-07-09 DIAGNOSIS — Z0289 Encounter for other administrative examinations: Secondary | ICD-10-CM

## 2018-07-10 ENCOUNTER — Encounter: Payer: Self-pay | Admitting: Family Medicine

## 2019-01-21 ENCOUNTER — Telehealth: Payer: Self-pay | Admitting: Family Medicine

## 2019-01-21 NOTE — Telephone Encounter (Signed)
Copied from CRM [id number]. Topic: General - Other >> Jan 21, 2019  2:34 PM Laural Benes, [place] C wrote: Reason for CRM: pt called in to schedule an ov with provider to be cleared to return to work. Pt says that he was asked within his wellness visit at work if he has ever had a substance abuse problem, pt says that he answered truthfully, that he battled substance abuse 11 years ago. Pt says that his job advised him to see his general provider to be cleared.

## 2019-01-22 ENCOUNTER — Encounter: Payer: Self-pay | Admitting: Family Medicine

## 2019-01-22 ENCOUNTER — Ambulatory Visit (INDEPENDENT_AMBULATORY_CARE_PROVIDER_SITE_OTHER): Payer: Managed Care, Other (non HMO) | Admitting: Family Medicine

## 2019-01-22 VITALS — BP 110/66 | HR 79 | Temp 98.0°F | Ht 74.0 in | Wt 199.2 lb

## 2019-01-22 DIAGNOSIS — F419 Anxiety disorder, unspecified: Secondary | ICD-10-CM

## 2019-01-22 DIAGNOSIS — F1911 Other psychoactive substance abuse, in remission: Secondary | ICD-10-CM | POA: Diagnosis not present

## 2019-01-22 DIAGNOSIS — F321 Major depressive disorder, single episode, moderate: Secondary | ICD-10-CM

## 2019-01-22 DIAGNOSIS — Z6825 Body mass index (BMI) 25.0-25.9, adult: Secondary | ICD-10-CM | POA: Diagnosis not present

## 2019-01-22 NOTE — Patient Instructions (Signed)
It was very nice to see you today!  We will write a letter for you to go back to work.  Come back to see me soon for your annual physical, or sooner as needed.   Take care, Dr Jimmey Ralph

## 2019-01-22 NOTE — Assessment & Plan Note (Addendum)
GAD negative.

## 2019-01-22 NOTE — Telephone Encounter (Signed)
Noted.  Patient on the schedule for today.

## 2019-01-22 NOTE — Progress Notes (Signed)
   Chief Complaint:  Douglas Fitzgerald is a 28 y.o. male who presents today with a chief complaint of history of substance abuse.   Assessment/Plan:  Substance abuse in remission [facility]) He has been abstinent from most 11 years.  Congratulated on this.  Letter was given stating he was fit to go back to work.  Depression, major, single episode, moderate (HCC) PHQ negative.  Does not need further medication or treatment at this time  Anxiety GAD negative.      Subjective:  HPI:  Depression/Anxiety, established problems, Stable Seen about 6 months ago for this. Was started on celexa however he did not tolerate due to side effects.  Overall, symptoms are stable.  Does not want to start any other medications at this point.  Depression screen [facility] 2/9 01/22/2019  Decreased Interest 0  Down, Depressed, Hopeless 0  PHQ - 2 Score 0  Altered sleeping 0  Tired, decreased energy 0  Change in appetite 0  Feeling bad or failure about yourself  -  Trouble concentrating 1  Moving slowly or fidgety/restless 0  Suicidal thoughts 0  PHQ-9 Score 1  Difficult doing work/chores Somewhat difficult   GAD 7 : Generalized Anxiety Score 01/22/2019  Nervous, Anxious, on Edge 0  Control/stop worrying 0  Worry too much - different things 0  Trouble relaxing 0  Restless 1  Easily annoyed or irritable 1  Afraid - awful might happen 2  Total GAD 7 Score 4  Anxiety Difficulty Somewhat difficult   Substance Abuse in Remission Has been clean for almost 11 years.  Recently was told that he needed to have a clearance from a physician to go back to work.  ROS: Per HPI  PMH: He reports that he has never smoked. He has never used smokeless tobacco. He reports that he does not drink alcohol or use drugs.      Objective:  Physical Exam: BP 110/66 (BP Location: Left Arm, Patient Position: Sitting, Cuff Size: Large)   Pulse 79   Temp 98 F (36.7 C) (Oral)   Ht 6\' 2"  (1.88 m)   Wt 199 lb 3.2 oz (90.4 kg)    SpO2 99%   BMI 25.58 kg/m   Gen: NAD, resting comfortably Psych: Normal affect and thought content     Caleb M. Jimmey Ralph, MD 01/22/2019 12:25 PM

## 2019-01-22 NOTE — Assessment & Plan Note (Addendum)
PHQ negative.  Does not need further medication or treatment at this time

## 2019-01-22 NOTE — Assessment & Plan Note (Signed)
He has been abstinent from most 11 years.  Congratulated on this.  Letter was given stating he was fit to go back to work.

## 2020-02-20 ENCOUNTER — Telehealth: Payer: Self-pay

## 2020-02-20 NOTE — Telephone Encounter (Signed)
Patient refused flu vaccine.

## 2024-11-13 ENCOUNTER — Ambulatory Visit: Admitting: Family Medicine
